# Patient Record
Sex: Female | Born: 2000 | Race: Black or African American | Hispanic: No | Marital: Single | Smoking: Current some day smoker
Health system: Southern US, Community
[De-identification: ages and names within clinical notes are randomized; demographics above are authoritative.]

---
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
## 2023-07-11 ENCOUNTER — Ambulatory Visit (INDEPENDENT_AMBULATORY_CARE_PROVIDER_SITE_OTHER): Payer: Medicaid Other

## 2023-07-11 ENCOUNTER — Encounter (HOSPITAL_COMMUNITY): Payer: Self-pay

## 2023-07-11 ENCOUNTER — Ambulatory Visit (HOSPITAL_COMMUNITY)
Admission: EM | Admit: 2023-07-11 | Discharge: 2023-07-11 | Disposition: A | Discharge status: Home / Self Care | Payer: Medicaid Other | Attending: Physician Assistant | Admitting: Physician Assistant

## 2023-07-11 DIAGNOSIS — M25532 Pain in left wrist: Secondary | ICD-10-CM

## 2023-07-11 MED ORDER — NAPROXEN 375 MG PO TABS: 375.0000 mg | ORAL_TABLET | Freq: Two times a day (BID) | ORAL | 0 refills | Status: AC
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
## 2023-07-11 NOTE — Discharge Instructions (Signed)
Your studies look normal but I will call you if the radiologist sees something and we need to change treatment plan.  Continue with the brace for comfort and support.  Keep it elevated particularly when you are resting.  Take Naprosyn up to twice a day.  Do not take NSAIDs with this medication including aspirin, ibuprofen/Advil, naproxen/Aleve.  Follow-up with [facility]; call to schedule an appointment.  If anything worsens and you have increasing pain, numbness, tingling, difficulty moving your wrist or hand you should be seen immediately.

## 2024-04-13 ENCOUNTER — Other Ambulatory Visit: Payer: Self-pay

## 2024-04-13 ENCOUNTER — Encounter (HOSPITAL_COMMUNITY): Payer: Self-pay | Admitting: Emergency Medicine

## 2024-04-13 ENCOUNTER — Ambulatory Visit (HOSPITAL_COMMUNITY)
Admission: EM | Admit: 2024-04-13 | Discharge: 2024-04-13 | Disposition: A | Discharge status: Home / Self Care | Payer: Self-pay | Attending: Internal Medicine | Admitting: Internal Medicine

## 2024-04-13 ENCOUNTER — Ambulatory Visit (INDEPENDENT_AMBULATORY_CARE_PROVIDER_SITE_OTHER): Payer: Self-pay

## 2024-04-13 DIAGNOSIS — M25511 Pain in right shoulder: Secondary | ICD-10-CM

## 2024-04-13 DIAGNOSIS — Z113 Encounter for screening for infections with a predominantly sexual mode of transmission: Secondary | ICD-10-CM | POA: Insufficient documentation

## 2024-04-13 DIAGNOSIS — S060X0A Concussion without loss of consciousness, initial encounter: Secondary | ICD-10-CM | POA: Insufficient documentation

## 2024-04-13 LAB — HIV ANTIBODY (ROUTINE TESTING W REFLEX): HIV Screen 4th Generation wRfx: NONREACTIVE
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
## 2024-04-14 LAB — CERVICOVAGINAL ANCILLARY ONLY
Chlamydia: NEGATIVE
Comment: NEGATIVE
Comment: NEGATIVE
Comment: NORMAL
Neisseria Gonorrhea: NEGATIVE
Trichomonas: NEGATIVE

## 2024-04-14 LAB — CYTOLOGY, (ORAL, ANAL, URETHRAL) ANCILLARY ONLY
Chlamydia: NEGATIVE
Comment: NEGATIVE
Comment: NORMAL
Neisseria Gonorrhea: NEGATIVE

## 2024-04-14 LAB — RPR: RPR Ser Ql: NONREACTIVE

## 2024-06-24 ENCOUNTER — Other Ambulatory Visit: Payer: Self-pay

## 2024-06-24 ENCOUNTER — Emergency Department (HOSPITAL_COMMUNITY)
Admission: EM | Admit: 2024-06-24 | Discharge: 2024-06-24 | Disposition: A | Discharge status: Home / Self Care | Payer: Self-pay | Attending: Emergency Medicine | Admitting: Emergency Medicine

## 2024-06-24 ENCOUNTER — Encounter (HOSPITAL_COMMUNITY): Payer: Self-pay

## 2024-06-24 DIAGNOSIS — L700 Acne vulgaris: Secondary | ICD-10-CM | POA: Insufficient documentation

## 2024-06-24 MED ORDER — DOXYCYCLINE HYCLATE 100 MG PO TABS: 100.0000 mg | ORAL_TABLET | Freq: Two times a day (BID) | ORAL | 0 refills | Status: AC

## 2024-06-24 NOTE — ED Notes (Signed)
 Pt gives verbal consent for mse

## 2024-06-24 NOTE — ED Triage Notes (Signed)
 Pt to er, pt states that she is here because she has some bumps on her tongue and thinks that her tongue looked purple.  Pt tongue is pink and mucous membranes are moist, resps even and unlabored

## 2024-06-24 NOTE — Discharge Instructions (Addendum)
 Please be careful not to bite your tongue.   We sent in doxycycline to your pharmacy, please take one tablet, twice a day, for 7 days. This medication can upset your stomach so please take it with food. This medication can also increase your skin sensitivity to the sun so use sunscreen and sun protection while taking it.   Please follow up with your primary care doctor to treat your acne.

## 2024-06-24 NOTE — ED Provider Notes (Signed)
 [place] EMERGENCY DEPARTMENT AT [facility] Provider Note   CSN: [id number] Arrival date & time: 06/24/24  1208     Patient presents with: Mouth Lesions   Tamara Hess is a 23 y.o. female.   The history is provided by the patient.  Mouth Lesions Location:  Upper lip, lower lip, tongue and below tongue Description of oral lesions: Bumpy. Onset quality:  Gradual Duration:  2 weeks Progression:  Unable to specify Chronicity:  New Context: not a change in diet, not a change in medications and not medications   Relieved by:  Nothing Worsened by:  Nothing Associated symptoms: no congestion, no fever, no neck pain, no rhinorrhea and no sore throat   Associated symptoms comment:  Notices some painful bumps on her chin and whiteheads on her nose      Prior to Admission medications   Medication Sig Start Date End Date Taking? Authorizing Provider  doxycycline (VIBRA-TABS) 100 MG tablet Take 1 tablet (100 mg total) by mouth 2 (two) times daily for 7 days. 06/24/24 07/01/24 Yes Damarian Priola, Viktoria, DO  naproxen  (NAPROSYN ) 375 MG tablet Take 1 tablet (375 mg total) by mouth 2 (two) times daily. 07/11/23   Raspet, Erin K, PA-C    Allergies: Patient has no known allergies.    Review of Systems  Constitutional:  Negative for activity change, appetite change, chills and fever.  HENT:  Positive for mouth sores. Negative for congestion, facial swelling, rhinorrhea, sore throat and trouble swallowing.   Respiratory:  Negative for cough, choking, chest tightness and shortness of breath.   Cardiovascular:  Negative for chest pain.  Gastrointestinal:  Negative for constipation and diarrhea.  Genitourinary:  Negative for decreased urine volume, difficulty urinating, dysuria, genital sores and vaginal discharge.  Musculoskeletal:  Negative for neck pain.  Skin:        Painful bumps on the chin  Neurological:  Negative for speech difficulty.    Updated Vital Signs BP 123/83 (BP  Location: Left Arm)   Pulse 67   Temp 98.1 F (36.7 C) (Oral)   Resp 18   SpO2 100%   Physical Exam  (all labs ordered are listed, but only abnormal results are displayed) Labs Reviewed - No data to display  EKG: None  Radiology: No results found.   Procedures   Medications Ordered in the ED - No data to display                                  Medical Decision Making Risk Prescription drug management.   This patient presents to the ED for concern of mouth bumps and bumps on her chin, this involves an extensive number of treatment options, and is a complaint that carries with it a high risk of complications and morbidity.  The differential diagnosis includes fissured tongue, geographic tongue, transient lingual papillitis, irritative lesions, nutritional deficiencies, less likely oral candidiasis She does not have any other symptoms and does not have any pain associated with the lesions it is likely a fissured tongue or transient lingual papillitis   Co morbidities / Chronic conditions that complicate the patient evaluation  None   Additional history obtained:  Additional history obtained from EMR External records from outside source obtained and reviewed including screening for STI in September was negative   Lab Tests:  I did not order any labs   Imaging Studies ordered:  None   Cardiac Monitoring: /  EKG:  None   Problem List / ED Course / Critical interventions / Medication management  Tongue trauma secondary to tongue biting Acne vulgaris on the chin I ordered medication including doxycycline for 7 days to the patient's pharmacy I have reviewed the patients home medicines and have made adjustments as needed   Consultations Obtained:  No consultations necessary   Test / Admission - Considered:  Not a candidate for admission discharged on oral doxycycline for treatment of acne vulgaris and recommended follow-up with PCP      Final  diagnoses:  Acne vulgaris    ED Discharge Orders          Ordered    doxycycline (VIBRA-TABS) 100 MG tablet  2 times daily        06/24/24 1333               Charmayne Holmes, DO 06/24/24 1341    Emil Share, DO 06/24/24 1342

## 2024-07-24 ENCOUNTER — Ambulatory Visit: Payer: Self-pay | Admitting: Nurse Practitioner

## 2024-08-14 ENCOUNTER — Other Ambulatory Visit: Payer: Self-pay

## 2024-08-14 ENCOUNTER — Encounter (HOSPITAL_COMMUNITY): Payer: Self-pay

## 2024-08-14 ENCOUNTER — Emergency Department (HOSPITAL_COMMUNITY)
Admission: EM | Admit: 2024-08-14 | Discharge: 2024-08-14 | Disposition: A | Discharge status: Home / Self Care | Attending: Emergency Medicine | Admitting: Emergency Medicine

## 2024-08-14 DIAGNOSIS — H66002 Acute suppurative otitis media without spontaneous rupture of ear drum, left ear: Secondary | ICD-10-CM | POA: Insufficient documentation

## 2024-08-14 DIAGNOSIS — K029 Dental caries, unspecified: Secondary | ICD-10-CM | POA: Insufficient documentation

## 2024-08-14 DIAGNOSIS — H60392 Other infective otitis externa, left ear: Secondary | ICD-10-CM | POA: Insufficient documentation

## 2024-08-14 DIAGNOSIS — K0889 Other specified disorders of teeth and supporting structures: Secondary | ICD-10-CM | POA: Diagnosis present

## 2024-08-14 MED ORDER — AMOXICILLIN 500 MG PO CAPS: 500.0000 mg | ORAL_CAPSULE | Freq: Three times a day (TID) | ORAL | 0 refills | Status: AC

## 2024-08-14 MED ORDER — CIPROFLOXACIN-DEXAMETHASONE 0.3-0.1 % OT SUSP: 4.0000 [drp] | Freq: Two times a day (BID) | OTIC | 0 refills | Status: AC

## 2024-08-14 NOTE — Discharge Instructions (Addendum)
 Please take the entire course of antibiotics that I prescribed.  I have attached some resources for counseling and therapy.  The attachment also has resources for substance abuse counseling but I specifically am referring you to the psychiatric therapy not for substance abuse counseling.  If you are still having problems with the ear pain, mouth pain after taking the antibiotics you can follow-up with the ear nose and throat doctor whose contact permission I provided above.

## 2024-08-14 NOTE — ED Triage Notes (Signed)
 Pt presents with mother c/o L ear pain for my whole life. Mother also states that she would like her to have therapy while here.

## 2024-08-14 NOTE — ED Provider Triage Note (Signed)
 Emergency Medicine Provider Triage Evaluation Note  Tamara Hess , a 24 y.o. female  was evaluated in triage.  Pt complains of left ear drainage and mouth sores in the past 2 months.  Patient notes waxy drainage from the ear in the morning.  She has not used any drops.  Patient also notes painful bumps on her face and in her mouth.  Review of Systems  Positive: Left ear drainage and pain, mouth sores Negative: Fevers, cough, chills numbness, weakness  Physical Exam  BP 121/79 (BP Location: Right Arm)   Pulse 82   Temp 98.4 F (36.9 C) (Oral)   Resp 18   SpO2 100%  Gen:   Awake, no distress   Resp:  Normal effort  MSK:   Moves extremities without difficulty  Other:  Bilateral ear with no signs of infection, TM not erythematous or bulging, no signs of mastoid tenderness or swelling.  No retraction or perforation noted.  Medical Decision Making  Medically screening exam initiated at 1:28 PM.  Appropriate orders placed.  Tamara Hess was informed that the remainder of the evaluation will be completed by another provider, this initial triage assessment does not replace that evaluation, and the importance of remaining in the ED until their evaluation is complete.     Braxton Dubois, PA-C 08/14/24 1351

## 2024-08-14 NOTE — ED Provider Notes (Signed)
 " [place] EMERGENCY DEPARTMENT AT [facility] Provider Note   CSN: [id number] Arrival date & time: 08/14/24  1135     Patient presents with: Otalgia   Tamara Hess is a 24 y.o. female with overall noncontributory past medical history presents with concern for multiple issues.  Patient endorses some intermittent pain, drainage for left ear.  She also endorses some right sided dental pain.  Mother also requesting some psychiatric resources.    Otalgia      Prior to Admission medications  Medication Sig Start Date End Date Taking? Authorizing Provider  amoxicillin  (AMOXIL ) 500 MG capsule Take 1 capsule (500 mg total) by mouth 3 (three) times daily. 08/14/24  Yes Jazlen Ogarro H, PA-C  ciprofloxacin -dexamethasone  (CIPRODEX ) OTIC suspension Place 4 drops into the left ear 2 (two) times daily. 08/14/24  Yes Tamana Hatfield H, PA-C  naproxen  (NAPROSYN ) 375 MG tablet Take 1 tablet (375 mg total) by mouth 2 (two) times daily. 07/11/23   Raspet, Erin K, PA-C    Allergies: Patient has no known allergies.    Review of Systems  HENT:  Positive for ear pain.   All other systems reviewed and are negative.   Updated Vital Signs BP 121/79 (BP Location: Right Arm)   Pulse 82   Temp 98.4 F (36.9 C) (Oral)   Resp 18   SpO2 100%   Physical Exam Vitals and nursing note reviewed.  Constitutional:      General: She is not in acute distress.    Appearance: Normal appearance.  HENT:     Head: Normocephalic and atraumatic.     Ears:     Comments: Left TM opaque, questionable effusion behind the ear.  Some whitish drainage, mild canal erythema.    Mouth/Throat:     Comments: Dental caries noted on lower right with some gum tenderness Eyes:     General:        Right eye: No discharge.        Left eye: No discharge.  Cardiovascular:     Rate and Rhythm: Normal rate and regular rhythm.  Pulmonary:     Effort: Pulmonary effort is normal. No respiratory distress.   Musculoskeletal:        General: No deformity.  Skin:    General: Skin is warm and dry.  Neurological:     Mental Status: She is alert and oriented to person, place, and time.  Psychiatric:        Mood and Affect: Mood normal.        Behavior: Behavior normal.     (all labs ordered are listed, but only abnormal results are displayed) Labs Reviewed - No data to display  EKG: None  Radiology: No results found.   Procedures   Medications Ordered in the ED - No data to display                                  Medical Decision Making  This patient is a 24 y.o. female who presents to the ED for concern of left ear pain, right dental pain, psychiatric resources with no SI, HI, AVH.   Differential diagnoses prior to evaluation: Otitis media, Externa, mastoiditis, tympanic membrane rupture.  Dental abscess, dental caries.  Past Medical History / Social History / Additional history: Chart reviewed. Pertinent results include: overall noncontributory  Physical Exam: Physical exam performed. The pertinent findings include: Left TM opaque, questionable  effusion behind the ear.  Some whitish drainage, mild canal erythema.    Mouth/Throat:     Comments: Dental caries noted on lower right with some gum tenderness  Medications / Treatment: Questionable otitis media, versus otitis externa as well as some dental caries.  Will treat with oral amoxicillin  as well as Ciprodex  drops.  Encourage ENT follow-up.  Will provide psychiatric resources, no emergent psychiatric etiology identified, no SI, HI, AVH.  Patient had recent death of a loved one.   Disposition: After consideration of the diagnostic results and the patients response to treatment, I feel that patient is stable for discharge with plan as above .   emergency department workup does not suggest an emergent condition requiring admission or immediate intervention beyond what has been performed at this time. The plan is: as above.  The patient is safe for discharge and has been instructed to return immediately for worsening symptoms, change in symptoms or any other concerns.   Final diagnoses:  Non-recurrent acute suppurative otitis media of left ear without spontaneous rupture of tympanic membrane  Pain due to dental caries  Other infective acute otitis externa of left ear    ED Discharge Orders          Ordered    amoxicillin  (AMOXIL ) 500 MG capsule  3 times daily        08/14/24 1550    ciprofloxacin -dexamethasone  (CIPRODEX ) OTIC suspension  2 times daily        08/14/24 1550               Ekaterini Capitano, Louise, PA-C 08/14/24 1556    Dreama Longs, MD 08/15/24 1246  "

## 2024-08-14 NOTE — ED Triage Notes (Signed)
 Pt arrives via POV. Pt reports intermittent pain and drainage in left ear for a long time now. She also states she has been breaking out in bumps on her face. She is AxOx4.

## 2024-08-29 ENCOUNTER — Ambulatory Visit (HOSPITAL_COMMUNITY)
Admission: EM | Admit: 2024-08-29 | Discharge: 2024-08-29 | Disposition: A | Discharge status: Home / Self Care | Attending: Internal Medicine | Admitting: Internal Medicine

## 2024-08-29 DIAGNOSIS — F1290 Cannabis use, unspecified, uncomplicated: Secondary | ICD-10-CM | POA: Insufficient documentation

## 2024-08-29 DIAGNOSIS — F319 Bipolar disorder, unspecified: Secondary | ICD-10-CM | POA: Insufficient documentation

## 2024-08-29 DIAGNOSIS — F1090 Alcohol use, unspecified, uncomplicated: Secondary | ICD-10-CM | POA: Insufficient documentation

## 2024-08-29 DIAGNOSIS — F209 Schizophrenia, unspecified: Secondary | ICD-10-CM | POA: Insufficient documentation

## 2024-08-29 DIAGNOSIS — R440 Auditory hallucinations: Secondary | ICD-10-CM

## 2024-08-29 DIAGNOSIS — F419 Anxiety disorder, unspecified: Secondary | ICD-10-CM | POA: Insufficient documentation

## 2024-08-29 DIAGNOSIS — Z9151 Personal history of suicidal behavior: Secondary | ICD-10-CM | POA: Insufficient documentation

## 2024-08-29 NOTE — Discharge Instructions (Addendum)
 Assessment/Therapy Walk-Ins will be available on Monday and Wednesdays Only  Monday/Wednesday: 8 AM until slots are full Every 1st and 2nd Friday of the month: 1 pm - 5 pm  For Monday/Wednesday, it is recommended that patients arrive between 7:30 AM and 7:45 AM because patients will be seen in the order of arrival.  Go to the second floor on arrival and check in. For Fridays, it is recommended that patients arrive between 12 noon and 12:30 pm. Go to the second floor on arrival and check in.  **Availability is limited; therefore, patients may not be seen on the same day.**  Psychiatry/Medication Management Walk-Ins will be available Monday-Friday  Monday-Friday: 8 AM to 11 AM.  It is recommended that patients arrive by 7:30 AM to 7:45 AM because patients will be seen in the order of arrival.  Go to the second floor on arrival and check in.  **Availability is limited; therefore, patients may not be seen on the same day.**   ______________________________________________________________________________________________________________________________   [facility]: Outpatient psychiatric Services:   Please see the walk in hours listed below.  Medication Management New Patient needing Medication Management Walk-in, and Existing Patients needing to see a provider for management coming as a walk in   Monday thru Friday 8:00 AM first come first serve until slots are full.  Recommend being there by 7:15 AM to ensure a slot is open.  Therapy New Patient Therapy Intake and Existing Patients needing to see therapist coming in as a walk in.   Monday, Wednesday, and Thursday morning at 8:00 am first come first serve.  Recommend being there by 7:15 AM to ensure a slot is open.    Every 1st, 2nd, and 3rd Friday at 1:00 PM first come first serve until slots are full.  Will still need to come in that morning at 7:15 AM to get registered for an afternoon slot.  For all  walk-ins we ask that you arrive by 7:15 am because patients will be seen in there order of arrival (FIRST COME FIRST SERVE) Availability is limited, therefore you may not be seen on the same day that you walk in if all slots are full.    Our goal is to serve and meet the needs of our community to the best of our ability.      If you would like to obtain outpatient services outside of the [facility] please contact one of the resources below:  [facility] [address] [place], [place] [address] Phone: [phone]   [facility] [address] [place], [place] [address]   [facility] [address] [place], [place] [address] Phone: [phone]  Please present to one of the following facilities to start medication management and therapy services:   [facility] [address], [place], [place] [address] [phone] [address] [place], [place], [place] [address] [phone]
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
